# Patient Record
Sex: Male | Born: 1965 | Race: White | Hispanic: Yes | Marital: Married | Smoking: Never smoker
Health system: Southern US, Community
[De-identification: ages and names within clinical notes are randomized; demographics above are authoritative.]

## PROBLEM LIST (undated history)

## (undated) DIAGNOSIS — K219 Gastro-esophageal reflux disease without esophagitis: Secondary | ICD-10-CM

## (undated) HISTORY — DX: Gastro-esophageal reflux disease without esophagitis: K21.9

## (undated) HISTORY — PX: APPENDECTOMY: SHX54

---

## 2008-01-29 ENCOUNTER — Emergency Department (HOSPITAL_COMMUNITY): Admission: EM | Admit: 2008-01-29 | Discharge: 2008-01-30 | Payer: Self-pay | Admitting: Emergency Medicine

## 2008-12-17 IMAGING — CR DG CHEST 2V
2 series · 2 of 2 positions shown · non-contrast
Comparison: None available

CHEST - 2 VIEW
CLINICAL DATA: Cough, muscle strain

[w chest pa]
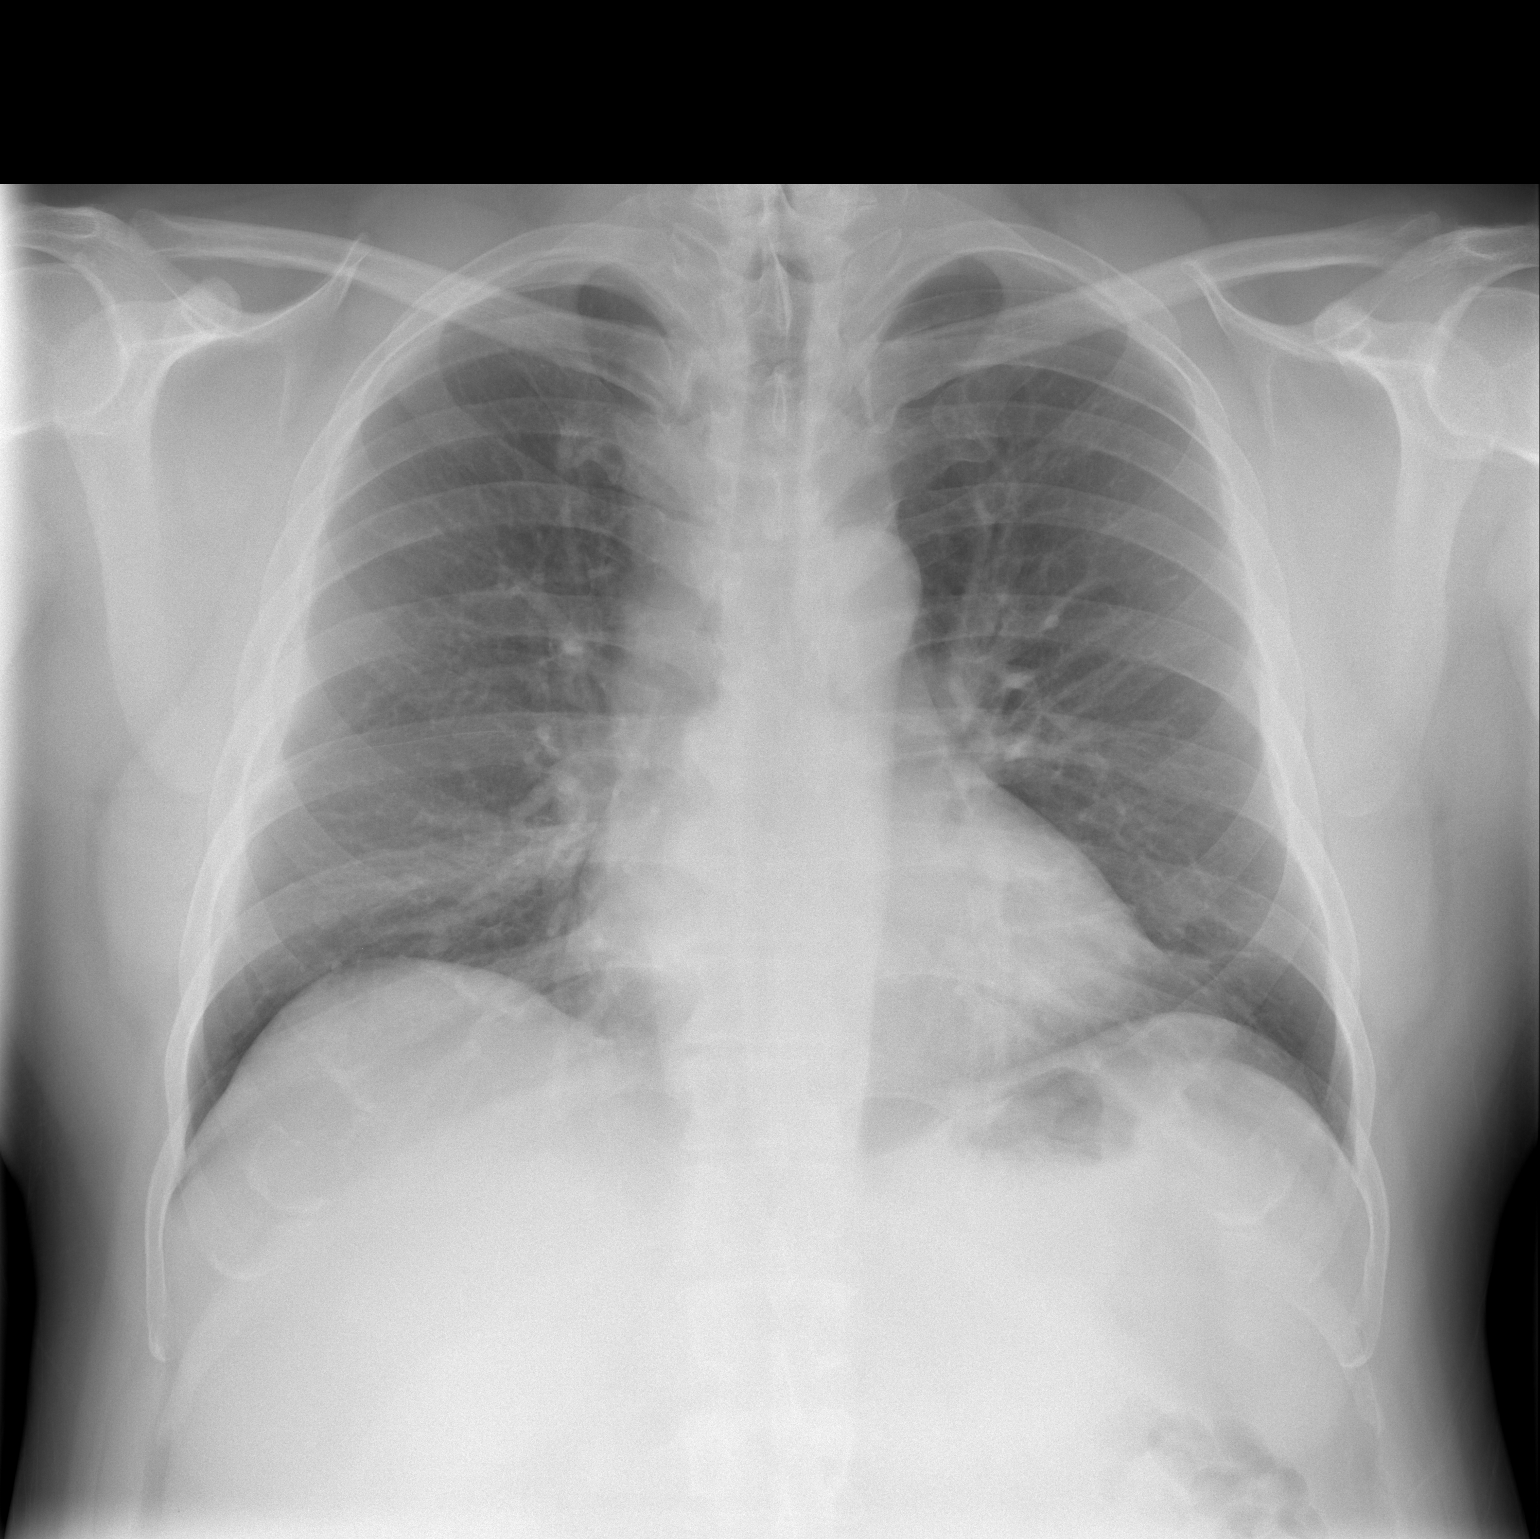

[w chest lat]
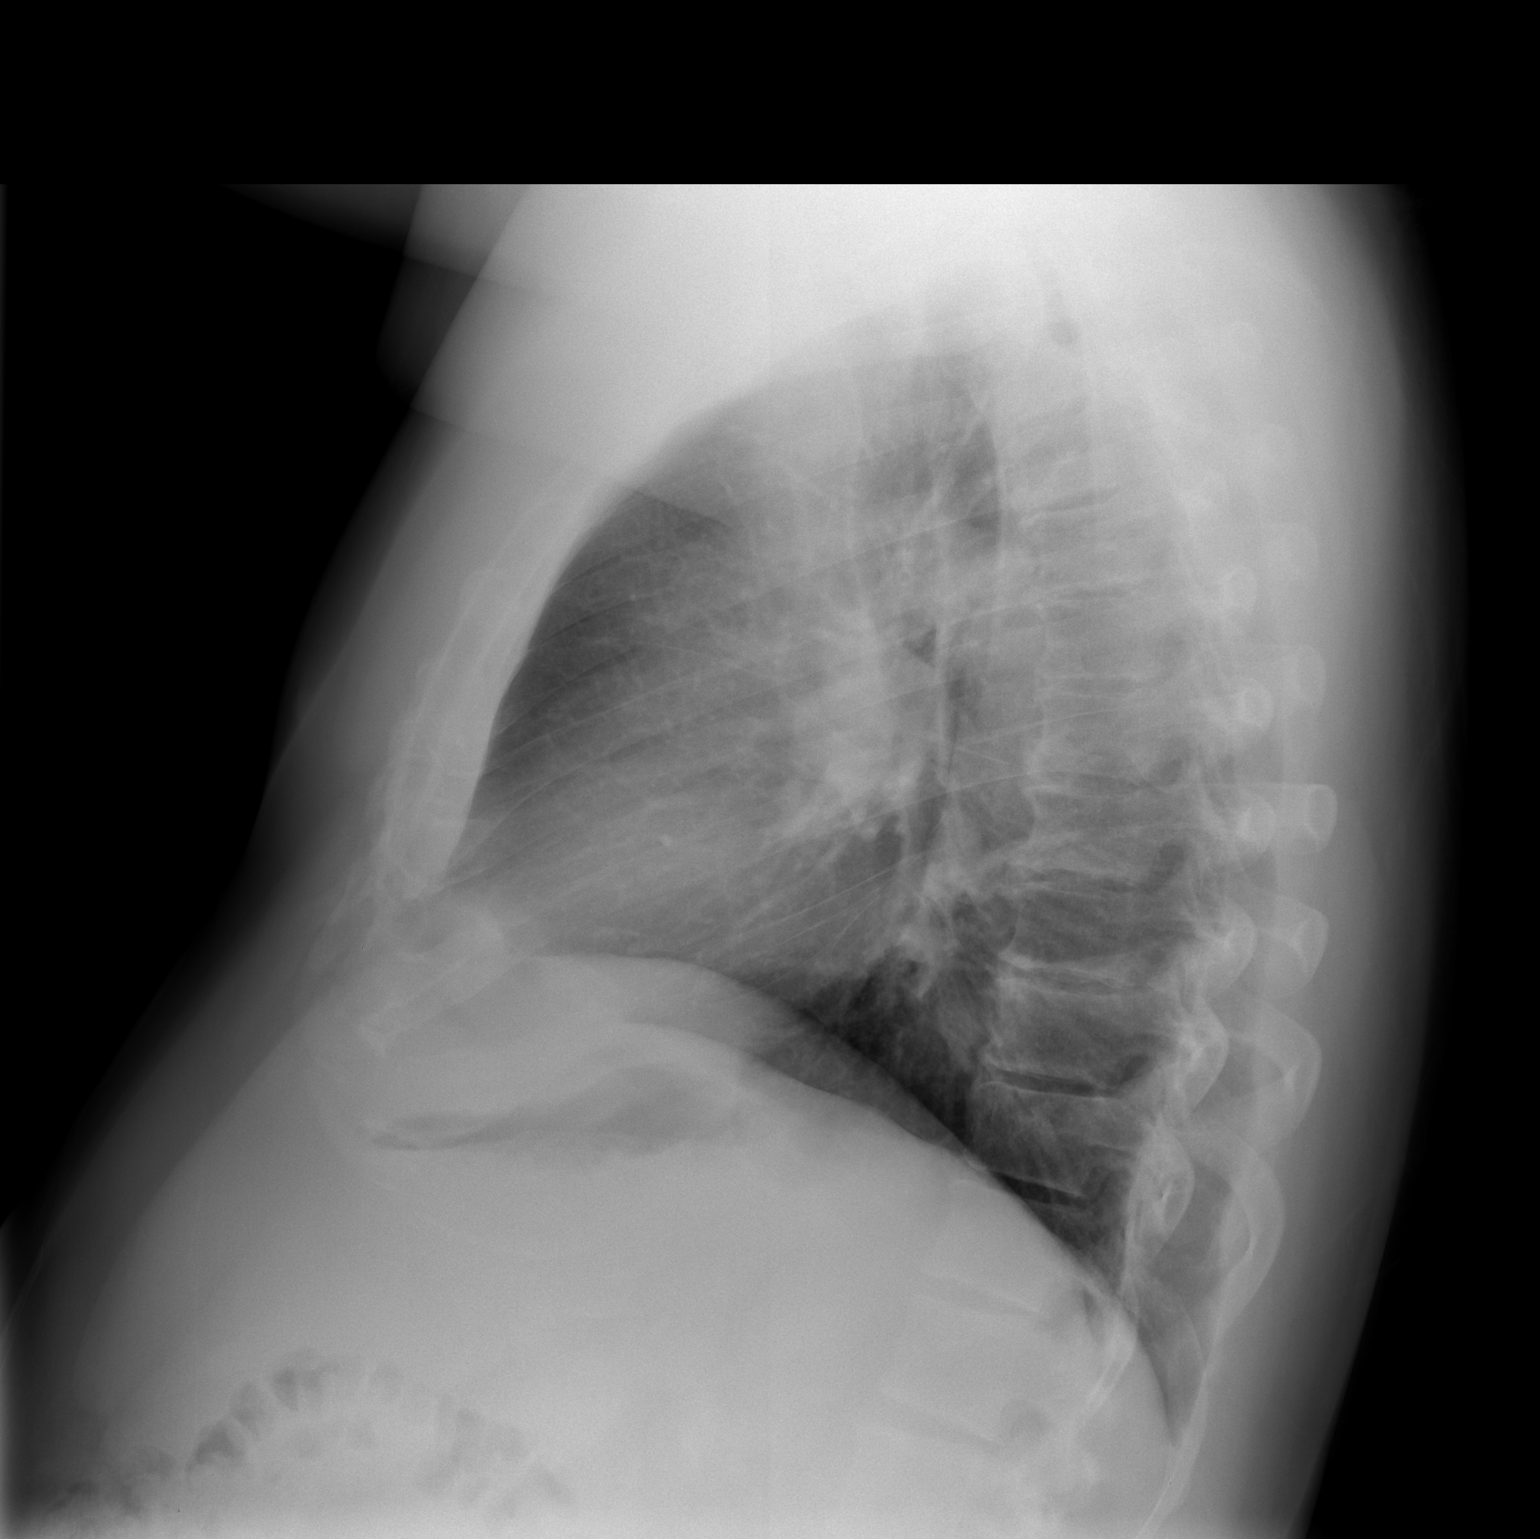

[2 of 2 positions shown; findings below may reference images not displayed]

FINDINGS: Left lung clear. Cardiomediastinal contours normal. Linear
atelectasis present at the right lung base, likely within right lower lobe based
on lateral view. No effusion or airspace disease. No displaced rib fracture. No
pneumothorax.
**********************************************************************
IMPRESSION
No active cardiopulmonary disease. Linear atelectasis left lower lobe. 
**********************************************************************

## 2022-06-12 ENCOUNTER — Ambulatory Visit: Payer: Self-pay

## 2022-06-12 NOTE — Telephone Encounter (Signed)
1 month hiccups, then acid reflux. Starts and gets to the point and and almost pass out. Limiting foods and drink.   Chief Complaint: severe hiccups Symptoms: pt stated began 1 month ago- will start having hiccups and will progress so that the hiccups are so frequent, pt felt like he was going to pass out. Pt stated he sees stars Frequency: began 1 month ago Pertinent Negatives: Patient denies chest pain, difficulty breathing (unless hiccups become severe) abdominal pain or vomiting. Pt has tried to make himself vomit but cannot.  Disposition: [] ED /[x] Urgent Care (no appt availability in office) / [] Appointment(In office/virtual)/ []  [facility]/ [] Home Care/ [] Refused Recommended Disposition /[] [facility]/ []  Follow-up with PCP Additional Notes: No PCP- Pt at UC at end of call.  Reason for Disposition  Hiccups are a chronic symptom (recurrent or ongoing AND present > 4 weeks)  Answer Assessment - Initial Assessment Questions 1. ONSET: "When did the hiccups begin?"      1 month ago 2. SEVERITY: "How bad are the hiccups now?"  (Severe: unable to eat, drink or sleep because of hiccups)     Severe- feels like going pass out and one after an other 3. TREATMENT: "What have you done so far to treat the hiccups?"     Eliminated food, drinks and then hiccups 4. RECURRENT SYMPTOM: "Have you ever had severe hiccups before?" If Yes, ask: "When was the last time?" and "What happened that time?"      no 5. OTHER SYMPTOMS: "Do you have any other symptoms? (e.g., chest pain, difficulty breathing,  abdominal pain, vomiting)     no  Protocols used: Hiccups-A-AH

## 2022-06-29 ENCOUNTER — Other Ambulatory Visit: Payer: Self-pay

## 2022-06-29 ENCOUNTER — Emergency Department (HOSPITAL_COMMUNITY): Payer: Self-pay

## 2022-06-29 ENCOUNTER — Encounter (HOSPITAL_COMMUNITY): Payer: Self-pay

## 2022-06-29 ENCOUNTER — Emergency Department (HOSPITAL_COMMUNITY)
Admission: EM | Admit: 2022-06-29 | Discharge: 2022-06-30 | Disposition: A | Discharge status: Home / Self Care | Payer: Self-pay | Attending: Emergency Medicine | Admitting: Emergency Medicine

## 2022-06-29 DIAGNOSIS — K219 Gastro-esophageal reflux disease without esophagitis: Secondary | ICD-10-CM | POA: Insufficient documentation

## 2022-06-29 DIAGNOSIS — R066 Hiccough: Secondary | ICD-10-CM | POA: Insufficient documentation

## 2022-06-29 LAB — CBC WITH DIFFERENTIAL/PLATELET
Abs Immature Granulocytes: 0.02 10*3/uL (ref 0.00–0.07)
Basophils Absolute: 0 10*3/uL (ref 0.0–0.1)
Basophils Relative: 0 %
Eosinophils Absolute: 0.1 10*3/uL (ref 0.0–0.5)
Eosinophils Relative: 1 %
HCT: 52.4 % — ABNORMAL HIGH (ref 39.0–52.0)
Hemoglobin: 17.2 g/dL — ABNORMAL HIGH (ref 13.0–17.0)
Immature Granulocytes: 0 %
Lymphocytes Relative: 21 %
Lymphs Abs: 1.8 10*3/uL (ref 0.7–4.0)
MCH: 27.7 pg (ref 26.0–34.0)
MCHC: 32.8 g/dL (ref 30.0–36.0)
MCV: 84.4 fL (ref 80.0–100.0)
Monocytes Absolute: 0.9 10*3/uL (ref 0.1–1.0)
Monocytes Relative: 10 %
Neutro Abs: 6.2 10*3/uL (ref 1.7–7.7)
Neutrophils Relative %: 68 %
Platelets: 253 10*3/uL (ref 150–400)
RBC: 6.21 MIL/uL — ABNORMAL HIGH (ref 4.22–5.81)
RDW: 15 % (ref 11.5–15.5)
WBC: 9 10*3/uL (ref 4.0–10.5)
nRBC: 0 % (ref 0.0–0.2)

## 2022-06-29 LAB — COMPREHENSIVE METABOLIC PANEL
ALT: 28 U/L (ref 0–44)
AST: 26 U/L (ref 15–41)
Albumin: 4.1 g/dL (ref 3.5–5.0)
Alkaline Phosphatase: 56 U/L (ref 38–126)
Anion gap: 8 (ref 5–15)
BUN: 16 mg/dL (ref 6–20)
CO2: 25 mmol/L (ref 22–32)
Calcium: 8.7 mg/dL — ABNORMAL LOW (ref 8.9–10.3)
Chloride: 103 mmol/L (ref 98–111)
Creatinine, Ser: 0.94 mg/dL (ref 0.61–1.24)
GFR, Estimated: >60 mL/min (ref >60)
Glucose, Bld: 93 mg/dL (ref 70–99)
Potassium: 3.9 mmol/L (ref 3.5–5.1)
Sodium: 136 mmol/L (ref 135–145)
Total Bilirubin: 0.7 mg/dL (ref 0.3–1.2)
Total Protein: 7.2 g/dL (ref 6.5–8.1)

## 2022-06-29 LAB — TROPONIN I (HIGH SENSITIVITY)
Troponin I (High Sensitivity): 4 ng/L (ref <18)
Troponin I (High Sensitivity): 3 ng/L (ref <18)

## 2022-06-29 LAB — LIPASE, BLOOD: Lipase: 58 U/L — ABNORMAL HIGH (ref 11–51)

## 2022-06-29 MED ORDER — PANTOPRAZOLE SODIUM 40 MG PO TBEC
40.0000 mg | DELAYED_RELEASE_TABLET | Freq: Every day | ORAL | Status: DC
  Administered 2022-06-29: 40 mg via ORAL
  Filled 2022-06-29: qty 1

## 2022-06-29 MED ORDER — ALUM & MAG HYDROXIDE-SIMETH 200-200-20 MG/5ML PO SUSP
30.0000 mL | Freq: Once | ORAL | Status: AC
  Administered 2022-06-29: 30 mL via ORAL
  Filled 2022-06-29: qty 30

## 2022-06-29 MED ORDER — IOHEXOL 300 MG/ML  SOLN
100.0000 mL | Freq: Once | INTRAMUSCULAR | Status: AC | PRN
  Administered 2022-06-29: 100 mL via INTRAVENOUS

## 2022-06-29 MED ORDER — OMEPRAZOLE 20 MG PO CPDR: 20.0000 mg | DELAYED_RELEASE_CAPSULE | Freq: Every day | ORAL | 0 refills | Status: DC

## 2022-06-29 NOTE — ED Provider Triage Note (Signed)
Emergency Medicine Provider Triage Evaluation Note  Darryl Nash , a 56 y.o. male  was evaluated in triage.  Pt complains of episodes of severe belching followed by constant hiccups that induce severe shortness of breath.  Patient states his been on and off for the last couple of months.  He notes that he will begin to belch which triggers severe hiccups to the point where he feels that he cannot breathe.  It is these instances of inability to breathe that brought him to the emergency department today.  He believes it may be related to a hiatal hernia that he has.  Review of Systems  Positive:  Negative: Chest pain, abdominal pain, nausea, vomiting and diarrhea  Physical Exam  BP (!) 152/90 (BP Location: Left Arm)   Pulse 67   Temp 98 F (36.7 C) (Oral)   Resp 18   Ht 5\' 5"  (1.651 m)   Wt 79.4 kg   SpO2 100%   BMI 29.12 kg/m  Gen:   Awake, no distress   Resp:  Normal effort  MSK:   Moves extremities without difficulty  Other:    Medical Decision Making  Medically screening exam initiated at 3:56 PM.  Appropriate orders placed.  Drayven Marchena was informed that the remainder of the evaluation will be completed by another provider, this initial triage assessment does not replace that evaluation, and the importance of remaining in the ED until their evaluation is complete.     Geanie Kenning, Janell Quiet 06/29/22 1557

## 2022-06-29 NOTE — ED Triage Notes (Signed)
Pt reports intermittent hiccups over the past 2 months. Pt reports hiccups become so bad that he has difficulty breathing. Pt reports hx of hiatal hernia. Denies N/V/D, chest pain, back pain, and SHOB.

## 2022-06-29 NOTE — ED Provider Notes (Signed)
[facility]-EMERGENCY DEPT Provider Note   CSN: [id number] Arrival date & time: 06/29/22  1536     History  Chief Complaint  Patient presents with   Gastroesophageal Reflux    Darryl Nash is a 56 y.o. male.  Pt is a 56 yo male with no significant pmhx.  The pt said he's had several episodes of belching with hiccups.  The pt said he gets such severe hiccups that cause him to not be able to breathe.  Pt has taken some apple cider vinegar and avoids foods like onions and tomatoes which worked at first, but is no longer helping.  Pt denies any pain.  No f/c.       Home Medications Prior to Admission medications   Medication Sig Start Date End Date Taking? Authorizing Provider  omeprazole (PRILOSEC) 20 MG capsule Take 1 capsule (20 mg total) by mouth daily. 06/29/22  Yes Jacalyn Lefevre, MD      Allergies    Patient has no known allergies.    Review of Systems   Review of Systems  Gastrointestinal:        Hiccups, belching  All other systems reviewed and are negative.   Physical Exam Updated Vital Signs BP (!) 137/93   Pulse (!) 57   Temp 98 F (36.7 C) (Oral)   Resp 14   Ht 5\' 5"  (1.651 m)   Wt 79.4 kg   SpO2 99%   BMI 29.12 kg/m  Physical Exam Vitals and nursing note reviewed.  Constitutional:      Appearance: Normal appearance.  HENT:     Head: Normocephalic and atraumatic.     Right Ear: External ear normal.     Left Ear: External ear normal.     Nose: Nose normal.     Mouth/Throat:     Mouth: Mucous membranes are moist.     Pharynx: Oropharynx is clear.  Eyes:     Extraocular Movements: Extraocular movements intact.     Conjunctiva/sclera: Conjunctivae normal.     Pupils: Pupils are equal, round, and reactive to light.  Cardiovascular:     Rate and Rhythm: Normal rate and regular rhythm.     Pulses: Normal pulses.     Heart sounds: Normal heart sounds.  Pulmonary:     Effort: Pulmonary effort is normal.     Breath sounds:  Normal breath sounds.  Abdominal:     General: Abdomen is flat. Bowel sounds are normal.     Palpations: Abdomen is soft.     Comments: Ventral hernia that is easily reducible and nontender  Musculoskeletal:        General: Normal range of motion.     Cervical back: Normal range of motion and neck supple.  Skin:    General: Skin is warm.     Capillary Refill: Capillary refill takes less than 2 seconds.  Neurological:     General: No focal deficit present.     Mental Status: He is alert and oriented to person, place, and time.  Psychiatric:        Mood and Affect: Mood normal.        Behavior: Behavior normal.     ED Results / Procedures / Treatments   Labs (all labs ordered are listed, but only abnormal results are displayed) Labs Reviewed  COMPREHENSIVE METABOLIC PANEL - Abnormal; Notable for the following components:      Result Value   Calcium 8.7 (*)    All other components  within normal limits  CBC WITH DIFFERENTIAL/PLATELET - Abnormal; Notable for the following components:   RBC 6.21 (*)    Hemoglobin 17.2 (*)    HCT 52.4 (*)    All other components within normal limits  LIPASE, BLOOD - Abnormal; Notable for the following components:   Lipase 58 (*)    All other components within normal limits  TROPONIN I (HIGH SENSITIVITY)  TROPONIN I (HIGH SENSITIVITY)    EKG EKG Interpretation  Date/Time:  Monday June 29 2022 15:48:03 EDT Ventricular Rate:  65 PR Interval:  170 QRS Duration: 86 QT Interval:  368 QTC Calculation: 383 R Axis:   17 Text Interpretation: Sinus rhythm Borderline T abnormalities, inferior leads No old tracing to compare Confirmed by Jacalyn Lefevre [phone]) on 06/29/2022 10:03:22 PM  Radiology CT ABDOMEN PELVIS W CONTRAST  Result Date: 06/29/2022 CLINICAL DATA:  Acute nonlocalized abdominal pain. Difficulty breathing due to hiccups over the past 2 months. EXAM: CT ABDOMEN AND PELVIS WITH CONTRAST TECHNIQUE: Multidetector CT imaging of the  abdomen and pelvis was performed using the standard protocol following bolus administration of intravenous contrast. RADIATION DOSE REDUCTION: This exam was performed according to the departmental dose-optimization program which includes automated exposure control, adjustment of the mA and/or kV according to patient size and/or use of iterative reconstruction technique. CONTRAST:  OMNIPAQUE IOHEXOL 300 MG/ML  SOLN COMPARISON:  None Available. FINDINGS: Lower chest: Lung bases are clear. Hepatobiliary: No focal liver abnormality is seen. No gallstones, gallbladder wall thickening, or biliary dilatation. Pancreas: Unremarkable. No pancreatic ductal dilatation or surrounding inflammatory changes. Spleen: Normal in size without focal abnormality. Adrenals/Urinary Tract: Adrenal glands are unremarkable. Kidneys are normal, without renal calculi, focal lesion, or hydronephrosis. Bladder is unremarkable. Stomach/Bowel: Stomach, small bowel, and colon are not abnormally distended. No wall thickening or inflammatory changes. Scattered colonic diverticula without evidence of acute diverticulitis. Appendix is not identified. Vascular/Lymphatic: Aortic atherosclerosis. No enlarged abdominal or pelvic lymph nodes. Reproductive: Prostate is unremarkable. Other: No abdominal wall hernia or abnormality. No abdominopelvic ascites. Musculoskeletal: No acute or significant osseous findings. IMPRESSION: No acute process demonstrated in the abdomen or pelvis. No evidence of bowel obstruction or inflammation. Aortic atherosclerosis. Electronically Signed   By: Burman Nieves M.D.   On: 06/29/2022 21:04   DG Chest 2 View  Result Date: 06/29/2022 CLINICAL DATA:  Hiccups EXAM: CHEST - 2 VIEW COMPARISON:  01/29/2008 FINDINGS: Linear scarring in the left base. No acute airspace disease. Normal cardiac size. No pneumothorax. IMPRESSION: No active cardiopulmonary disease. Electronically Signed   By: Jasmine Pang M.D.   On:  06/29/2022 16:42    Procedures Procedures    Medications Ordered in ED Medications  pantoprazole (PROTONIX) EC tablet 40 mg (40 mg Oral Given 06/29/22 2112)  alum & mag hydroxide-simeth (MAALOX/MYLANTA) 200-200-20 MG/5ML suspension 30 mL (30 mLs Oral Given 06/29/22 2112)  iohexol (OMNIPAQUE) 300 MG/ML solution 100 mL (100 mLs Intravenous Contrast Given 06/29/22 2052)    ED Course/ Medical Decision Making/ A&P                           Medical Decision Making Amount and/or Complexity of Data Reviewed Radiology: ordered.  Risk OTC drugs. Prescription drug management.   This patient presents to the ED for concern of hiccups, this involves an extensive number of treatment options, and is a complaint that carries with it a high risk of complications and morbidity.  The differential diagnosis includes gerd, mass,  infection   Co morbidities that complicate the patient evaluation  none   Additional history obtained:  Additional history obtained from epic chart review External records from outside source obtained and reviewed including wife   Lab Tests:  I Ordered, and personally interpreted labs.  The pertinent results include:  cbc with hbg 17.2, cmp nl, lip 58, trop nl   Imaging Studies ordered:  I ordered imaging studies including cxr and ct abd/pelvis  I independently visualized and interpreted imaging which showed  CXR: IMPRESSION:  No active cardiopulmonary disease.  CT abd/pelvis: IMPRESSION:  No acute process demonstrated in the abdomen or pelvis. No evidence  of bowel obstruction or inflammation. Aortic atherosclerosis.   I agree with the radiologist interpretation   Cardiac Monitoring:  The patient was maintained on a cardiac monitor.  I personally viewed and interpreted the cardiac monitored which showed an underlying rhythm of: nsr   Medicines ordered and prescription drug management:  I ordered medication including gi cocktail and protonix  for  hiccups/gerd  Reevaluation of the patient after these medicines showed that the patient improved I have reviewed the patients home medicines and have made adjustments as needed   Test Considered:  ct   Critical Interventions:  ppis   Problem List / ED Course:  Hiccups:  likely due to gerd.  Pt does not have current hiccups.  He will be started on omeprazole.  He is to establish care with pcp.  He needs to f/u with gi.   Reevaluation:  After the interventions noted above, I reevaluated the patient and found that they have :improved   Social Determinants of Health:  No insurance.  I will ask sw to help pt get set up with pcp and with gi   Dispostion:  After consideration of the diagnostic results and the patients response to treatment, I feel that the patent would benefit from discharge with outpatient f/u.          Final Clinical Impression(s) / ED Diagnoses Final diagnoses:  Hiccups  Gastroesophageal reflux disease without esophagitis    Rx / DC Orders ED Discharge Orders          Ordered    Ambulatory referral to Gastroenterology        06/29/22 2334    omeprazole (PRILOSEC) 20 MG capsule  Daily        06/29/22 2338              Jacalyn Lefevre, MD 06/29/22 2339

## 2022-07-01 ENCOUNTER — Encounter: Payer: Self-pay | Admitting: Gastroenterology

## 2022-07-01 NOTE — Progress Notes (Signed)
Subjective:    Darryl Nash - 56 y.o. male MRN [id number]  Date of birth: 06/21/1966  HPI  [name] is to establish care and emergency department follow-up.   Current issues and/or concerns: 06/29/2022 - 06/30/2022 [facility] per MD note: Medical Decision Making Amount and/or Complexity of Data Reviewed Radiology: ordered.   Risk OTC drugs. Prescription drug management.     This patient presents to the ED for concern of hiccups, this involves an extensive number of treatment options, and is a complaint that carries with it a high risk of complications and morbidity.  The differential diagnosis includes gerd, mass, infection     Co morbidities that complicate the patient evaluation   none     Additional history obtained:   Additional history obtained from epic chart review External records from outside source obtained and reviewed including wife     Lab Tests:   I Ordered, and personally interpreted labs.  The pertinent results include:  cbc with hbg 17.2, cmp nl, lip 58, trop nl    Imaging Studies ordered:   I ordered imaging studies including cxr and ct abd/pelvis  I independently visualized and interpreted imaging which showed  CXR: IMPRESSION:  No active cardiopulmonary disease.  CT abd/pelvis: IMPRESSION:  No acute process demonstrated in the abdomen or pelvis. No evidence  of bowel obstruction or inflammation. Aortic atherosclerosis.    I agree with the radiologist interpretation     Cardiac Monitoring:   The patient was maintained on a cardiac monitor.  I personally viewed and interpreted the cardiac monitored which showed an underlying rhythm of: nsr     Medicines ordered and prescription drug management:   I ordered medication including gi cocktail and protonix  for hiccups/gerd  Reevaluation of the patient after these medicines showed that the patient improved I have reviewed the patients home medicines and  have made adjustments as needed     Test Considered:   ct     Critical Interventions:   ppis     Problem List / ED Course:   Hiccups:  likely due to gerd.  Pt does not have current hiccups.  He will be started on omeprazole.  He is to establish care with pcp.  He needs to f/u with gi.     Reevaluation:   After the interventions noted above, I reevaluated the patient and found that they have :improved     Social Determinants of Health:   No insurance.  I will ask sw to help pt get set up with pcp and with gi     Dispostion:   After consideration of the diagnostic results and the patients response to treatment, I feel that the patent would benefit from discharge with outpatient f/u.     Today's visit 07/09/2022: Acid reflux and hiccups persisting. Notices spicy foods and caffeine make worse. Needs refill on Omeprazole. Has appointment scheduled with Gastroenterology soon.    ROS per HPI     Health Maintenance:  Health Maintenance Due  Topic Date Due   HIV Screening  Never done   Hepatitis C Screening  Never done   COLONOSCOPY (Pts 45-93yrs Insurance coverage will need to be confirmed)  Never done   Zoster Vaccines- Shingrix (1 of 2) Never done   INFLUENZA VACCINE  07/07/2022     Past Medical History: There are no problems to display for this patient.   Social History   reports that he has never smoked. He has  never been exposed to tobacco smoke. He has never used smokeless tobacco. He reports that he does not drink alcohol and does not use drugs.   Family History  family history is not on file.   Medications: reviewed and updated   Objective:   Physical Exam BP 130/81 (BP Location: Left Arm, Patient Position: Sitting, Cuff Size: Large)   Pulse 66   Temp 98.3 F (36.8 C)   Resp 16   Ht 5' 5.75" (1.67 m)   Wt 173 lb (78.5 kg)   SpO2 98%   BMI 28.14 kg/m   Physical Exam HENT:     Head: Normocephalic and atraumatic.  Eyes:     Extraocular  Movements: Extraocular movements intact.     Conjunctiva/sclera: Conjunctivae normal.     Pupils: Pupils are equal, round, and reactive to light.  Cardiovascular:     Rate and Rhythm: Normal rate and regular rhythm.     Pulses: Normal pulses.     Heart sounds: Normal heart sounds.  Pulmonary:     Effort: Pulmonary effort is normal.     Breath sounds: Normal breath sounds.  Musculoskeletal:     Cervical back: Normal range of motion and neck supple.  Neurological:     General: No focal deficit present.     Mental Status: He is alert and oriented to person, place, and time.  Psychiatric:        Mood and Affect: Mood normal.        Behavior: Behavior normal.       Assessment & Plan:  1. Encounter to establish care - Patient presents today to establish care.  - Return for annual physical examination, labs, and health maintenance. Arrive fasting meaning having no food for at least 8 hours prior to appointment. You may have only water or black coffee. Please take scheduled medications as normal.  2. Gastroesophageal reflux disease, unspecified whether esophagitis present 3. Hiccups - Continue Omeprazole as prescribed.  - Keep appointment 07/28/2022 with Tiajuana Amass, MD at Gastroenterology.  - omeprazole (PRILOSEC) 20 MG capsule; Take 1 capsule (20 mg total) by mouth daily.  Dispense: 30 capsule; Refill: 0  4. Aortic atherosclerosis (HCC) - Referral to Cardiology for further evaluation and management.  - Ambulatory referral to Cardiology  5. Need for shingles vaccine - Administered.  - Varicella-zoster vaccine IM    Patient was given clear instructions to go to Emergency Department or return to medical center if symptoms don't improve, worsen, or new problems develop.The patient verbalized understanding.  I discussed the assessment and treatment plan with the patient. The patient was provided an opportunity to ask questions and all were answered. The patient agreed with the  plan and demonstrated an understanding of the instructions.   The patient was advised to call back or seek an in-person evaluation if the symptoms worsen or if the condition fails to improve as anticipated.    Ricky Stabs, NP 07/09/2022, 10:30 AM Primary Care at [facility]

## 2022-07-09 ENCOUNTER — Encounter: Payer: Self-pay | Admitting: Family

## 2022-07-09 ENCOUNTER — Ambulatory Visit (INDEPENDENT_AMBULATORY_CARE_PROVIDER_SITE_OTHER): Payer: Self-pay | Admitting: Family

## 2022-07-09 VITALS — BP 130/81 | HR 66 | Temp 98.3°F | Resp 16 | Ht 65.75 in | Wt 173.0 lb

## 2022-07-09 DIAGNOSIS — Z23 Encounter for immunization: Secondary | ICD-10-CM

## 2022-07-09 DIAGNOSIS — R066 Hiccough: Secondary | ICD-10-CM

## 2022-07-09 DIAGNOSIS — K219 Gastro-esophageal reflux disease without esophagitis: Secondary | ICD-10-CM

## 2022-07-09 DIAGNOSIS — I700 Atherosclerosis of aorta: Secondary | ICD-10-CM

## 2022-07-09 DIAGNOSIS — Z7689 Persons encountering health services in other specified circumstances: Secondary | ICD-10-CM

## 2022-07-09 MED ORDER — OMEPRAZOLE 20 MG PO CPDR: 20.0000 mg | DELAYED_RELEASE_CAPSULE | Freq: Every day | ORAL | 0 refills | Status: AC

## 2022-07-09 NOTE — Progress Notes (Signed)
.  Pt presents to establish care, pt has appt w/GI on 07/28/22  states that he is having major issue w/hiccups  Needs refill on Omeprazole   Pt wants Shingrix vaccine today

## 2022-07-09 NOTE — Patient Instructions (Addendum)
   Thank you for choosing Primary Care at [facility] for your medical home!    Darryl Nash was seen by Rema Fendt, NP today.   Lynne Logan Towery's primary care provider is Patient, No Pcp Per.   For the best care possible,  you should try to see Ricky Stabs, NP whenever you come to office.   We look forward to seeing you again soon!  If you have any questions about your visit today,  please call us at [phone]  Or feel free to reach your provider via MyChart.    Keep appointment scheduled with Gastroenterology.  Referral to Cardiology for aortic atherosclerosis. Their office should call within 2 weeks with appointment details.   Keeping you healthy   Get these tests Blood pressure- Have your blood pressure checked once a year by your healthcare provider.  Normal blood pressure is 120/80. Weight- Have your body mass index (BMI) calculated to screen for obesity.  BMI is a measure of body fat based on height and weight. You can also calculate your own BMI at https://www.west-esparza.com/. Cholesterol- Have your cholesterol checked regularly starting at age 66, sooner may be necessary if you have diabetes, high blood pressure, if a family member developed heart diseases at an early age or if you smoke.  Chlamydia, HIV, and other sexual transmitted disease- Get screened each year until the age of 34 then within three months of each new sexual partner. Diabetes- Have your blood sugar checked regularly if you have high blood pressure, high cholesterol, a family history of diabetes or if you are overweight.   Get these vaccines Flu shot- Every fall. Tetanus shot- Every 10 years. Menactra- Single dose; prevents meningitis.   Take these steps Don't smoke- If you do smoke, ask your healthcare provider about quitting. For tips on how to quit, go to www.smokefree.gov or call 1-800-QUIT-NOW. Be physically active- Exercise 5 days a week for at least 30 minutes.  If you are not already  physically active start slow and gradually work up to 30 minutes of moderate physical activity.  Examples of moderate activity include walking briskly, mowing the yard, dancing, swimming bicycling, etc. Eat a healthy diet- Eat a variety of healthy foods such as fruits, vegetables, low fat milk, low fat cheese, yogurt, lean meats, poultry, fish, beans, tofu, etc.  For more information on healthy eating, go to www.thenutritionsource.org Drink alcohol in moderation- Limit alcohol intake two drinks or less a day.  Never drink and drive. Dentist- Brush and floss teeth twice daily; visit your dentis twice a year. Depression-Your emotional health is as important as your physical health.  If you're feeling down, losing interest in things you normally enjoy please talk with your healthcare provider. Gun Safety- If you keep a gun in your home, keep it unloaded and with the safety lock on.  Bullets should be stored separately. Helmet use- Always wear a helmet when riding a motorcycle, bicycle, rollerblading or skateboarding. Safe sex- If you may be exposed to a sexually transmitted infection, use a condom Seat belts- Seat bels can save your life; always wear one. Smoke/Carbon Monoxide detectors- These detectors need to be installed on the appropriate level of your home.  Replace batteries at least once a year. Skin Cancer- When out in the sun, cover up and use sunscreen SPF 15 or higher. Violence- If anyone is threatening or hurting you, please tell your healthcare provider.

## 2022-07-27 NOTE — Progress Notes (Deleted)
Cardiology Office Note:    Date:  07/27/2022   ID:  Darryl Nash, DOB [birth year], MRN [id number]  PCP:  Rema Fendt, NP   [facility] Providers Cardiologist:  None { Click to update primary MD,subspecialty MD or APP then REFRESH:1}    Referring MD: Rema Fendt, NP   No chief complaint on file. ***  History of Present Illness:    Darryl Nash is a 56 y.o. male seen at the request of Ricky Stabs NP for evaluation of aortic atherosclerosis noted on CT.   No past medical history on file.  No past surgical history on file.  Current Medications: No outpatient medications have been marked as taking for the 07/31/22 encounter (Appointment) with [place], Prinston Kynard M, MD.     Allergies:   Patient has no known allergies.   Social History   Socioeconomic History   Marital status: Married    Spouse name: Not on file   Number of children: Not on file   Years of education: Not on file   Highest education level: Not on file  Occupational History   Not on file  Tobacco Use   Smoking status: Never    Passive exposure: Never   Smokeless tobacco: Never  Vaping Use   Vaping Use: Never used  Substance and Sexual Activity   Alcohol use: Never   Drug use: Never   Sexual activity: Not on file  Other Topics Concern   Not on file  Social History Narrative   Not on file   Social Determinants of Health   Financial Resource Strain: Not on file  Food Insecurity: Not on file  Transportation Needs: Not on file  Physical Activity: Not on file  Stress: Not on file  Social Connections: Not on file     Family History: The patient's ***family history is not on file.  ROS:   Please see the history of present illness.    *** All other systems reviewed and are negative.  EKGs/Labs/Other Studies Reviewed:    The following studies were reviewed today: ***  EKG:  EKG is *** ordered today.  The ekg ordered today demonstrates ***  Recent Labs: 06/29/2022: ALT 28; BUN  16; Creatinine, Ser 0.94; Hemoglobin 17.2; Platelets 253; Potassium 3.9; Sodium 136  Recent Lipid Panel No results found for: "CHOL", "TRIG", "HDL", "CHOLHDL", "VLDL", "LDLCALC", "LDLDIRECT"   Risk Assessment/Calculations:   {Does this patient have ATRIAL FIBRILLATION?:[id number]}  No BP recorded.  {Refresh Note OR Click here to enter BP  :1}***         Physical Exam:    VS:  There were no vitals taken for this visit.    Wt Readings from Last 3 Encounters:  07/09/22 173 lb (78.5 kg)  06/29/22 175 lb (79.4 kg)     GEN: *** Well nourished, well developed in no acute distress HEENT: Normal NECK: No JVD; No carotid bruits LYMPHATICS: No lymphadenopathy CARDIAC: ***RRR, no murmurs, rubs, gallops RESPIRATORY:  Clear to auscultation without rales, wheezing or rhonchi  ABDOMEN: Soft, non-tender, non-distended MUSCULOSKELETAL:  No edema; No deformity  SKIN: Warm and dry NEUROLOGIC:  Alert and oriented x 3 PSYCHIATRIC:  Normal affect   ASSESSMENT:    No diagnosis found. PLAN:    In order of problems listed above:  ***      {Are you ordering a CV Procedure (e.g. stress test, cath, DCCV, TEE, etc)?   Press F2        :[id number]}    Medication  Adjustments/Labs and Tests Ordered: Current medicines are reviewed at length with the patient today.  Concerns regarding medicines are outlined above.  No orders of the defined types were placed in this encounter.  No orders of the defined types were placed in this encounter.   There are no Patient Instructions on file for this visit.   Signed, Earsie Humm [place], MD  07/27/2022 12:52 PM    [facility]

## 2022-07-28 ENCOUNTER — Encounter: Payer: Self-pay | Admitting: Gastroenterology

## 2022-07-28 ENCOUNTER — Ambulatory Visit (INDEPENDENT_AMBULATORY_CARE_PROVIDER_SITE_OTHER): Payer: Self-pay | Admitting: Gastroenterology

## 2022-07-28 VITALS — BP 120/80 | HR 68 | Ht 65.0 in | Wt 178.5 lb

## 2022-07-28 DIAGNOSIS — R066 Hiccough: Secondary | ICD-10-CM

## 2022-07-28 DIAGNOSIS — Z1211 Encounter for screening for malignant neoplasm of colon: Secondary | ICD-10-CM

## 2022-07-28 DIAGNOSIS — K219 Gastro-esophageal reflux disease without esophagitis: Secondary | ICD-10-CM

## 2022-07-28 MED ORDER — BACLOFEN 10 MG PO TABS: 10.0000 mg | ORAL_TABLET | Freq: Three times a day (TID) | ORAL | 1 refills | Status: AC

## 2022-07-28 NOTE — Progress Notes (Unsigned)
HPI : Darryl Nash is a very pleasant 56 year old male with no significant past medical history who is referred to [place] by Dr. Jacalyn Lefevre for further evaluation of chronic hiccups.  Patient states that he has been bothered by these hiccups for the past 2 or 3 months.  They tend to be episodic, and will persist for hours before resolving.  His last episode was this past Friday.  Sometimes, the episodes are so severe that he has periods where he cannot breathe.  This lasts for maybe 10 to 15 seconds.  He has even had syncopal episodes related to these hiccups and apnea episodes. Last October, he did have problems with hiccups lasting a few days, but other than that he has never had any issues with recurrent hiccups.  He does note that he has noted that some of his hiccup episodes are precipitated by eating a lot of food and drink in a short amount of time He was started on omeprazole recently for the symptoms, and does think that this is helping.  He denies any history of typical GERD symptoms such as heartburn or acid regurgitation.  He does however report episodes of hoarseness and throat irritation.  No dysphagia.  No abdominal pain or dyspepsia.  His appetite is good and his weight is stable.  He denies nausea or vomiting. He states that he used to drink excessive amounts of carbonated mineral water, usually 4 to 6/day.  He has stopped drinking this, and now only drinks regular water.  He very rarely drinks alcohol.  He does not smoke. He went to the emergency department July 24 because of the symptoms and a CT scan of the abdomen pelvis was performed which was negative.  Chest x-ray was also normal.  The patient denies any chronic lower GI symptoms to include abdominal pain, constipation, diarrhea or hematochezia.  He has never had a colonoscopy.  He has no family history of GI malignancy.     Past Medical History:  Diagnosis Date   GERD (gastroesophageal reflux disease)      Past  Surgical History:  Procedure Laterality Date   APPENDECTOMY     Family History  Problem Relation Age of Onset   Diabetes Mother    Lung cancer Father        smoker   Throat cancer Father    Diabetes Maternal Aunt    Social History   Tobacco Use   Smoking status: Never    Passive exposure: Never   Smokeless tobacco: Never  Vaping Use   Vaping Use: Never used  Substance Use Topics   Alcohol use: Never   Drug use: Never   Current Outpatient Medications  Medication Sig Dispense Refill   omeprazole (PRILOSEC) 20 MG capsule Take 1 capsule (20 mg total) by mouth daily. 30 capsule 0   No current facility-administered medications for this visit.   Not on File   Review of Systems: All systems reviewed and negative except where noted in HPI.    CT ABDOMEN PELVIS W CONTRAST  Result Date: 06/29/2022 CLINICAL DATA:  Acute nonlocalized abdominal pain. Difficulty breathing due to hiccups over the past 2 months. EXAM: CT ABDOMEN AND PELVIS WITH CONTRAST TECHNIQUE: Multidetector CT imaging of the abdomen and pelvis was performed using the standard protocol following bolus administration of intravenous contrast. RADIATION DOSE REDUCTION: This exam was performed according to the departmental dose-optimization program which includes automated exposure control, adjustment of the mA and/or kV according to patient size and/or  use of iterative reconstruction technique. CONTRAST:  OMNIPAQUE IOHEXOL 300 MG/ML  SOLN COMPARISON:  None Available. FINDINGS: Lower chest: Lung bases are clear. Hepatobiliary: No focal liver abnormality is seen. No gallstones, gallbladder wall thickening, or biliary dilatation. Pancreas: Unremarkable. No pancreatic ductal dilatation or surrounding inflammatory changes. Spleen: Normal in size without focal abnormality. Adrenals/Urinary Tract: Adrenal glands are unremarkable. Kidneys are normal, without renal calculi, focal lesion, or hydronephrosis. Bladder is unremarkable.  Stomach/Bowel: Stomach, small bowel, and colon are not abnormally distended. No wall thickening or inflammatory changes. Scattered colonic diverticula without evidence of acute diverticulitis. Appendix is not identified. Vascular/Lymphatic: Aortic atherosclerosis. No enlarged abdominal or pelvic lymph nodes. Reproductive: Prostate is unremarkable. Other: No abdominal wall hernia or abnormality. No abdominopelvic ascites. Musculoskeletal: No acute or significant osseous findings. IMPRESSION: No acute process demonstrated in the abdomen or pelvis. No evidence of bowel obstruction or inflammation. Aortic atherosclerosis. Electronically Signed   By: Burman Nieves M.D.   On: 06/29/2022 21:04   DG Chest 2 View  Result Date: 06/29/2022 CLINICAL DATA:  Hiccups EXAM: CHEST - 2 VIEW COMPARISON:  01/29/2008 FINDINGS: Linear scarring in the left base. No acute airspace disease. Normal cardiac size. No pneumothorax. IMPRESSION: No active cardiopulmonary disease. Electronically Signed   By: Jasmine Pang M.D.   On: 06/29/2022 16:42    Physical Exam: BP 120/80   Pulse 68   Ht 5\' 5"  (1.651 m)   Wt 178 lb 8 oz (81 kg)   BMI 29.70 kg/m  Constitutional: Pleasant,well-developed, muscular Hispanic male in no acute distress.  Accompanied by his son HEENT: Normocephalic and atraumatic. Conjunctivae are normal. No scleral icterus. Neck supple.  Cardiovascular: Normal rate, regular rhythm.  Pulmonary/chest: Effort normal and breath sounds normal. No wheezing, rales or rhonchi. Abdominal: Soft, nondistended, nontender. Bowel sounds active throughout. There are no masses palpable. No hepatomegaly. Extremities: no edema Lymphadenopathy: No cervical adenopathy noted. Neurological: Alert and oriented to person place and time. Skin: Skin is warm and dry. No rashes noted. Psychiatric: Normal mood and affect. Behavior is normal.  CBC    Component Value Date/Time   WBC 9.0 06/29/2022 1622   RBC 6.21 (H) 06/29/2022  1622   HGB 17.2 (H) 06/29/2022 1622   HCT 52.4 (H) 06/29/2022 1622   PLT 253 06/29/2022 1622   MCV 84.4 06/29/2022 1622   MCH 27.7 06/29/2022 1622   MCHC 32.8 06/29/2022 1622   RDW 15.0 06/29/2022 1622   LYMPHSABS 1.8 06/29/2022 1622   MONOABS 0.9 06/29/2022 1622   EOSABS 0.1 06/29/2022 1622   BASOSABS 0.0 06/29/2022 1622    CMP     Component Value Date/Time   NA 136 06/29/2022 1622   K 3.9 06/29/2022 1622   CL 103 06/29/2022 1622   CO2 25 06/29/2022 1622   GLUCOSE 93 06/29/2022 1622   BUN 16 06/29/2022 1622   CREATININE 0.94 06/29/2022 1622   CALCIUM 8.7 (L) 06/29/2022 1622   PROT 7.2 06/29/2022 1622   ALBUMIN 4.1 06/29/2022 1622   AST 26 06/29/2022 1622   ALT 28 06/29/2022 1622   ALKPHOS 56 06/29/2022 1622   BILITOT 0.7 06/29/2022 1622   GFRNONAA >60 06/29/2022 1622     ASSESSMENT AND PLAN: 56 year old male with 2 to 3 months of episodic refractory hiccups.  CT scan unremarkable.  He has some atypical GERD symptoms, and has seen some improvement in his hiccup frequency since being started on omeprazole.  We discussed the somewhat enigmatic nature of hiccups.  Given that  his symptoms are not persistent/constant, I think it very unlikely that there is a mass lesion or an anatomic explanation for his symptoms.  I did suggest an upper endoscopy just to evaluate his esophagus and stomach and rule out concerning etiologies such as mass lesion.  I think this is very unlikely, but I thought it was reasonable.  The patient does not have insurance, and did not want to do it unless absolutely necessary. Given his parital improvement with PPI and lack of other concerning symptoms, I think it is reasonable to hold off on an upper endoscopy.   We discussed other medications that have been used to help with refractory hiccups.  Specifically, we discussed baclofen, gabapentin and Reglan.  We agreed to start some baclofen, starting at 10 mg at night, and increasing to 3 times a day if needed  and if tolerated.  He should continue omeprazole.  We discussed the principles of antireflux diet and suggested behavioral modifications to reduce reflux. We also discussed colon cancer screening.  Given that he does not have insurance, he was not wanting to proceed with a colonoscopy given the high cost.  I recommended a fecal occult blood test.  Refractory hiccups -Start baclofen 10 mg nightly, increase to 3 times daily if needed and if tolerated -Continue GERD diet and lifestyle modifications (particularly avoiding carbonated beverages, eating small frequent meals, avoiding overeating, continue to avoid alcohol and tobacco, avoid eating within 3 to 4 hours of bedtime. -Continue omeprazole daily - Reconsider upper endoscopy if symptoms not improving  Colon cancer screening - FIT test  Luverta Korte E. Tomasa Rand, MD [facility]  CC:  Jacalyn Lefevre, MD

## 2022-07-28 NOTE — Patient Instructions (Signed)
If you are age 58 or older, your body mass index should be between 23-30. Your Body mass index is 29.7 kg/m. If this is out of the aforementioned range listed, please consider follow up with your Primary Care Provider.  If you are age 67 or younger, your body mass index should be between 19-25. Your Body mass index is 29.7 kg/m. If this is out of the aformentioned range listed, please consider follow up with your Primary Care Provider.   We have sent the following medications to your pharmacy for you to pick up at your convenience: Baclofen 10 mg three times daily.   Your provider has requested that you go to the basement level for lab work before leaving today. Press "B" on the elevator. The lab is located at the first door on the left as you exit the elevator.  The  GI providers would like to encourage you to use [facility] to communicate with providers for non-urgent requests or questions.  Due to long hold times on the telephone, sending your provider a message by [facility] may be a faster and more efficient way to get a response.  Please allow 48 business hours for a response.  Please remember that this is for non-urgent requests.   It was a pleasure to see you today!  Thank you for trusting me with your gastrointestinal care!    Scott E.Tomasa Rand, MD

## 2022-07-31 ENCOUNTER — Ambulatory Visit: Payer: Self-pay | Admitting: Cardiology

## 2022-08-03 NOTE — Progress Notes (Signed)
Erroneous encounter-disregard

## 2022-08-14 ENCOUNTER — Encounter: Payer: Self-pay | Admitting: Family

## 2022-08-14 DIAGNOSIS — Z13220 Encounter for screening for lipoid disorders: Secondary | ICD-10-CM

## 2022-08-14 DIAGNOSIS — Z131 Encounter for screening for diabetes mellitus: Secondary | ICD-10-CM

## 2022-08-14 DIAGNOSIS — Z1329 Encounter for screening for other suspected endocrine disorder: Secondary | ICD-10-CM

## 2022-08-14 DIAGNOSIS — Z0000 Encounter for general adult medical examination without abnormal findings: Secondary | ICD-10-CM

## 2022-08-14 DIAGNOSIS — Z1211 Encounter for screening for malignant neoplasm of colon: Secondary | ICD-10-CM

## 2022-08-14 DIAGNOSIS — Z1159 Encounter for screening for other viral diseases: Secondary | ICD-10-CM

## 2022-08-14 DIAGNOSIS — Z114 Encounter for screening for human immunodeficiency virus [HIV]: Secondary | ICD-10-CM

## 2023-10-11 ENCOUNTER — Ambulatory Visit (INDEPENDENT_AMBULATORY_CARE_PROVIDER_SITE_OTHER): Payer: Self-pay | Admitting: Plastic Surgery

## 2023-10-11 DIAGNOSIS — H02831 Dermatochalasis of right upper eyelid: Secondary | ICD-10-CM

## 2023-10-11 DIAGNOSIS — H02835 Dermatochalasis of left lower eyelid: Secondary | ICD-10-CM

## 2023-10-11 DIAGNOSIS — H02839 Dermatochalasis of unspecified eye, unspecified eyelid: Secondary | ICD-10-CM | POA: Insufficient documentation

## 2023-10-11 DIAGNOSIS — H02832 Dermatochalasis of right lower eyelid: Secondary | ICD-10-CM

## 2023-10-11 DIAGNOSIS — H02834 Dermatochalasis of left upper eyelid: Secondary | ICD-10-CM

## 2023-10-11 NOTE — Progress Notes (Signed)
     Patient ID: Darryl Nash, male    DOB: [birth year], 57 y.o.   MRN: [id number]   Chief Complaint  Patient presents with   Cosmetic Visit    The patient is a 57 year old male here for evaluation of his lower lids.  On exam he actually has dermatochalasis of the upper lids as well with herniation of the fat.  He has a skin lesion on the left lower lid laterally with herniation of the fat pads as well.  He denies any issues with his eyes.  He does wear glasses.    Review of Systems  Constitutional: Negative.   HENT: Negative.    Eyes: Negative.   Respiratory: Negative.    Cardiovascular: Negative.   Endocrine: Negative.   Genitourinary: Negative.   Musculoskeletal: Negative.     Past Medical History:  Diagnosis Date   GERD (gastroesophageal reflux disease)     Past Surgical History:  Procedure Laterality Date   APPENDECTOMY        Current Outpatient Medications:    baclofen (LIORESAL) 10 MG tablet, Take 1 tablet (10 mg total) by mouth 3 (three) times daily., Disp: 30 each, Rfl: 1   esomeprazole (NEXIUM) 40 MG capsule, Take 40 mg by mouth daily., Disp: , Rfl:    omeprazole (PRILOSEC) 20 MG capsule, Take 1 capsule (20 mg total) by mouth daily., Disp: 30 capsule, Rfl: 0   PAPAYA ENZYME PO, Take 3 tablets by mouth in the morning, at noon, and at bedtime., Disp: , Rfl:    Objective:   There were no vitals filed for this visit.  Physical Exam Constitutional:      Appearance: Normal appearance.  HENT:     Head: Normocephalic and atraumatic.  Cardiovascular:     Rate and Rhythm: Normal rate.  Musculoskeletal:        General: No swelling or deformity.  Skin:    Coloration: Skin is not jaundiced.     Findings: No bruising.  Neurological:     Mental Status: He is alert and oriented to person, place, and time.  Psychiatric:        Mood and Affect: Mood normal.        Behavior: Behavior normal.        Thought Content: Thought content normal.        Judgment: Judgment  normal.     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  I have sent the information over to Dr. Dimas Millin.  Patient is in agreement.  Pictures were obtained of the patient and placed in the chart with the patient's or guardian's permission.   Alena Bills Corynne Scibilia, DO
